# Patient Record
Sex: Female | Born: 1990 | Race: Black or African American | Hispanic: No | Marital: Single | Smoking: Never smoker
Health system: Southern US, Community
[De-identification: ages and names within clinical notes are randomized; demographics above are authoritative.]

## PROBLEM LIST (undated history)

## (undated) DIAGNOSIS — R000 Tachycardia, unspecified: Secondary | ICD-10-CM

## (undated) DIAGNOSIS — D649 Anemia, unspecified: Secondary | ICD-10-CM

## (undated) DIAGNOSIS — F419 Anxiety disorder, unspecified: Secondary | ICD-10-CM

## (undated) HISTORY — DX: Anemia, unspecified: D64.9

## (undated) HISTORY — DX: Anxiety disorder, unspecified: F41.9

## (undated) HISTORY — PX: PILONIDAL CYST EXCISION: SHX744

---

## 2015-03-14 ENCOUNTER — Encounter (HOSPITAL_COMMUNITY): Payer: Self-pay | Admitting: Emergency Medicine

## 2015-03-14 ENCOUNTER — Emergency Department (HOSPITAL_COMMUNITY)
Admission: EM | Admit: 2015-03-14 | Discharge: 2015-03-14 | Disposition: A | Discharge status: Home / Self Care | Payer: BLUE CROSS/BLUE SHIELD | Source: Home / Self Care | Attending: Family Medicine | Admitting: Family Medicine

## 2015-03-14 DIAGNOSIS — B354 Tinea corporis: Secondary | ICD-10-CM | POA: Diagnosis not present

## 2015-03-14 HISTORY — DX: Tachycardia, unspecified: R00.0

## 2015-03-14 MED ORDER — TERBINAFINE HCL 250 MG PO TABS: 250.0000 mg | ORAL_TABLET | Freq: Every day | ORAL | Status: DC

## 2015-03-14 MED ORDER — TERBINAFINE HCL 1 % EX CREA: 1.0000 "application " | TOPICAL_CREAM | Freq: Two times a day (BID) | CUTANEOUS | Status: DC

## 2015-03-14 NOTE — Discharge Instructions (Signed)
Use medicines as prescribed, return or see your doctor if further problems

## 2015-03-14 NOTE — ED Notes (Signed)
Pt developed a spot below her left ear around Father's Day, since then she has developed more spots below her right ear, her left arm, and parts of her breasts and upper front torso.  She denies any fevers and she denies pain or itching.

## 2015-03-14 NOTE — ED Provider Notes (Signed)
CSN: [id number]     Arrival date & time 03/14/15  1949 History   First MD Initiated Contact with Patient 03/14/15 2008     Chief Complaint  Patient presents with  . Rash   (Consider location/radiation/quality/duration/timing/severity/associated sxs/prior Treatment) Patient is a 24 y.o. female presenting with rash. The history is provided by the patient.  Rash Location:  Full body (not below waist.) Quality: blistering and redness   Quality: not itchy and not painful   Severity:  Mild Onset quality:  Gradual Duration:  3 weeks Progression:  Spreading Chronicity:  New Relieved by:  None tried Worsened by:  Nothing tried Ineffective treatments:  None tried Associated symptoms: no fever     Past Medical History  Diagnosis Date  . Tachycardia    Past Surgical History  Procedure Laterality Date  . Pilonidal cyst excision     History reviewed. No pertinent family history. History  Substance Use Topics  . Smoking status: Never Smoker   . Smokeless tobacco: Not on file  . Alcohol Use: Yes     Comment: occasional   OB History    No data available     Review of Systems  Constitutional: Negative.  Negative for fever.  Skin: Positive for rash.    Allergies  Review of patient's allergies indicates no known allergies.  Home Medications   Prior to Admission medications   Medication Sig Start Date End Date Taking? Authorizing Provider  Dextroamphetamine Sulfate (ZENZEDI) 20 MG TABS Take 20 mg by mouth daily.   Yes Historical Provider, MD  topiramate (TOPAMAX) 25 MG tablet Take 75 mg by mouth daily.   Yes Historical Provider, MD  terbinafine (LAMISIL) 1 % cream Apply 1 application topically 2 (two) times daily. 03/14/15   Linna HoffJames D Kindl, MD  terbinafine (LAMISIL) 250 MG tablet Take 1 tablet (250 mg total) by mouth daily. 03/14/15   Linna HoffJames D Kindl, MD   BP 114/80 mmHg  Pulse 110  Temp(Src) 98.7 F (37.1 C) (Oral)  Resp 18  SpO2 100%  LMP 02/08/2015 (Exact Date) Physical Exam   Constitutional: She is oriented to person, place, and time. She appears well-developed and well-nourished. No distress.  Musculoskeletal: Normal range of motion.  Neurological: She is alert and oriented to person, place, and time.  Skin: Skin is warm and dry. Rash noted. No pallor.  approx 10 isolated sl raised erythematous lesions with some central clearing, nonpustular, nonpruritic, nontender, irreg distribution above waist.  Nursing note and vitals reviewed.   ED Course  Procedures (including critical care time) Labs Review Labs Reviewed - No data to display  Imaging Review No results found.   MDM   1. Tinea corporis        Linna HoffJames D Kindl, MD 03/14/15 2047

## 2015-09-11 ENCOUNTER — Ambulatory Visit (INDEPENDENT_AMBULATORY_CARE_PROVIDER_SITE_OTHER): Payer: BLUE CROSS/BLUE SHIELD | Admitting: Physician Assistant

## 2015-09-11 VITALS — BP 122/84 | HR 85 | Temp 98.5°F | Resp 18 | Ht 67.75 in | Wt 287.0 lb

## 2015-09-11 DIAGNOSIS — Z23 Encounter for immunization: Secondary | ICD-10-CM

## 2015-09-11 DIAGNOSIS — Z111 Encounter for screening for respiratory tuberculosis: Secondary | ICD-10-CM

## 2015-09-11 DIAGNOSIS — Z0289 Encounter for other administrative examinations: Secondary | ICD-10-CM

## 2015-09-11 NOTE — Progress Notes (Signed)
   09/11/2015 8:13 PM   DOB: 23-Jan-1991 / MRN: [id number]  SUBJECTIVE:  Vickie [name] is a 25 y.o. female presenting for an administrative physical.  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  She has immunization records with her and they reveal the need for a TDAP.  She needs a TB screen.  No history of positive, persistent cough, weight loss, and night sweats.    She has No Known Allergies.   She  has a past medical history of Tachycardia and Anemia.    She  reports that she has never smoked. She does not have any smokeless tobacco history on file. She reports that she drinks alcohol. She reports that she does not use illicit drugs. She  has no sexual activity history on file. The patient  has past surgical history that includes Pilonidal cyst excision.  Her family history includes Diabetes in her maternal grandmother; Hyperlipidemia in her maternal grandfather and maternal grandmother; Hypertension in her maternal grandfather and maternal grandmother.  Review of Systems  Constitutional: Negative for fever and chills.  Eyes: Negative for blurred vision.  Respiratory: Negative for cough and shortness of breath.   Cardiovascular: Negative for chest pain.  Gastrointestinal: Negative for nausea and abdominal pain.  Genitourinary: Negative for dysuria, urgency and frequency.  Musculoskeletal: Negative for myalgias.  Skin: Negative for rash.  Neurological: Negative for dizziness, tingling and headaches.  Psychiatric/Behavioral: Negative for depression. The patient is not nervous/anxious.     Problem list and medications reviewed and updated by myself where necessary, and exist elsewhere in the encounter.   OBJECTIVE:  BP 122/84 mmHg  Pulse 85  Temp(Src) 98.5 F (36.9 C) (Oral)  Resp 18  Ht 5' 7.75" (1.721 m)  Wt 287 lb (130.182 kg)  BMI 43.95 kg/m2  SpO2 98%  LMP 09/05/2015  Physical Exam  Constitutional: She is oriented to person,  place, and time. She appears well-nourished. No distress.  Eyes: EOM are normal. Pupils are equal, round, and reactive to light.  Cardiovascular: Normal rate and regular rhythm.   Pulmonary/Chest: Effort normal and breath sounds normal.  Abdominal: She exhibits no distension.  Neurological: She is alert and oriented to person, place, and time. No cranial nerve deficit. Gait normal.  Skin: Skin is dry. She is not diaphoretic.  Psychiatric: She has a normal mood and affect.  Vitals reviewed.     Visual Acuity Screening   Right eye Left eye Both eyes  Without correction:     With correction: 20/15 20/13 20/15     No results found for this or any previous visit (from the past 48 hour(s)).  ASSESSMENT AND PLAN  Vickie [name] was seen today for employment physical.  Diagnoses and all orders for this visit:  Encounter for occupational history and physical examination -     Care order/instruction  Need for Tdap vaccination -     Tdap vaccine greater than or equal to 7yo IM  Screening-pulmonary TB -     TB Skin Test    The patient was advised to call or return to clinic if she does not see an improvement in symptoms or to seek the care of the closest emergency department if she worsens with the above plan.   Deliah BostonMichael Britney Captain, MHS, PA-C [facility] 09/11/2015 8:13 PM

## 2015-09-11 NOTE — Progress Notes (Signed)
   Subjective:    Patient ID: Vickie [name], female    DOB: [birth year], 25 y.o.   MRN: [id number]  HPI    Review of Systems     Objective:   Physical Exam  . Tuberculosis Risk Questionnaire  1. No Were you born outside the [place] in one of the following parts of the world: [place], [place], [place], [place] or [place]?    2. No Have you traveled outside the [place] and lived for more than one month in one of the following parts of the world: [place], [place], [place], [place] or [place]?    3. No Do you have a compromised immune system such as from any of the following conditions:HIV/AIDS, organ or bone marrow transplantation, diabetes, immunosuppressive medicines (e.g. Prednisone, Remicaide), leukemia, lymphoma, cancer of the head or neck, gastrectomy or jejunal bypass, end-stage renal disease (on dialysis), or silicosis?     4. No Have you ever or do you plan on working in: a residential care center, a health care facility, a jail or prison or homeless shelter?    5. No Have you ever: injected illegal drugs, used crack cocaine, lived in a homeless shelter  or been in jail or prison?     6. No Have you ever been exposed to anyone with infectious tuberculosis?    Tuberculosis Symptom Questionnaire  Do you currently have any of the following symptoms?  1. No Unexplained cough lasting more than 3 weeks?   2. No Unexplained fever lasting more than 3 weeks.   3. No Night Sweats (sweating that leaves the bedclothes and sheets wet)     4. No Shortness of Breath   5. No Chest Pain   6. No Unintentional weight loss    7. No Unexplained fatigue (very tired for no reason)        Assessment & Plan:

## 2015-09-19 ENCOUNTER — Ambulatory Visit (INDEPENDENT_AMBULATORY_CARE_PROVIDER_SITE_OTHER): Payer: BLUE CROSS/BLUE SHIELD | Admitting: *Deleted

## 2015-09-19 DIAGNOSIS — Z111 Encounter for screening for respiratory tuberculosis: Secondary | ICD-10-CM

## 2015-09-19 NOTE — Progress Notes (Signed)
   Subjective:    Patient ID: Vickie [name], female    DOB: 04-30-1991, 25 y.o.   MRN: [id number]  HPI   Tuberculosis Risk Questionnaire  1. No Were you born outside the [place] in one of the following parts of the world: [place], [place], [place], [place] or [place]?    2. No Have you traveled outside the [place] and lived for more than one month in one of the following parts of the world: [place], [place], [place], [place] or [place]?    3. No Do you have a compromised immune system such as from any of the following conditions:HIV/AIDS, organ or bone marrow transplantation, diabetes, immunosuppressive medicines (e.g. Prednisone, Remicaide), leukemia, lymphoma, cancer of the head or neck, gastrectomy or jejunal bypass, end-stage renal disease (on dialysis), or silicosis?     4. No Have you ever or do you plan on working in: a residential care center, a health care facility, a jail or prison or homeless shelter?    5. No Have you ever: injected illegal drugs, used crack cocaine, lived in a homeless shelter  or been in jail or prison?     6. No Have you ever been exposed to anyone with infectious tuberculosis?    Tuberculosis Symptom Questionnaire  Do you currently have any of the following symptoms?  1. No Unexplained cough lasting more than 3 weeks?   2. No Unexplained fever lasting more than 3 weeks.   3. No Night Sweats (sweating that leaves the bedclothes and sheets wet)     4. No Shortness of Breath   5. No Chest Pain   6. No Unintentional weight loss    7. No Unexplained fatigue (very tired for no reason)    Review of Systems     Objective:   Physical Exam        Assessment & Plan:

## 2015-09-22 ENCOUNTER — Ambulatory Visit (INDEPENDENT_AMBULATORY_CARE_PROVIDER_SITE_OTHER): Payer: BLUE CROSS/BLUE SHIELD | Admitting: *Deleted

## 2015-09-22 DIAGNOSIS — Z111 Encounter for screening for respiratory tuberculosis: Secondary | ICD-10-CM

## 2015-09-22 LAB — TB SKIN TEST
Induration: 0 mm
TB Skin Test: NEGATIVE

## 2015-10-27 ENCOUNTER — Encounter (HOSPITAL_COMMUNITY): Payer: Self-pay | Admitting: *Deleted

## 2015-10-27 ENCOUNTER — Emergency Department (INDEPENDENT_AMBULATORY_CARE_PROVIDER_SITE_OTHER)
Admission: EM | Admit: 2015-10-27 | Discharge: 2015-10-27 | Disposition: A | Discharge status: Home / Self Care | Payer: BLUE CROSS/BLUE SHIELD | Source: Home / Self Care | Attending: Family Medicine | Admitting: Family Medicine

## 2015-10-27 DIAGNOSIS — R69 Illness, unspecified: Principal | ICD-10-CM

## 2015-10-27 DIAGNOSIS — J111 Influenza due to unidentified influenza virus with other respiratory manifestations: Secondary | ICD-10-CM

## 2015-10-27 MED ORDER — IPRATROPIUM BROMIDE 0.06 % NA SOLN: 2.0000 | Freq: Four times a day (QID) | NASAL | Status: DC

## 2015-10-27 MED ORDER — GUAIFENESIN-CODEINE 100-10 MG/5ML PO SYRP: 10.0000 mL | ORAL_SOLUTION | Freq: Four times a day (QID) | ORAL | Status: DC | PRN

## 2015-10-27 NOTE — ED Provider Notes (Signed)
CSN: [id number]     Arrival date & time 10/27/15  1757 History   First MD Initiated Contact with Patient 10/27/15 1952     Chief Complaint  Patient presents with  . Generalized Body Aches   (Consider location/radiation/quality/duration/timing/severity/associated sxs/prior Treatment) Patient is a 25 y.o. female presenting with flu symptoms. The history is provided by the patient.  Influenza Presenting symptoms: cough, fever, myalgias, rhinorrhea and sore throat   Severity:  Moderate Onset quality:  Sudden Duration:  2 days Chronicity:  New Relieved by:  None tried Ineffective treatments:  OTC medications Associated symptoms: chills and nasal congestion     Past Medical History  Diagnosis Date  . Tachycardia   . Anemia    Past Surgical History  Procedure Laterality Date  . Pilonidal cyst excision     Family History  Problem Relation Age of Onset  . Diabetes Maternal Grandmother   . Hyperlipidemia Maternal Grandmother   . Hypertension Maternal Grandmother   . Hyperlipidemia Maternal Grandfather   . Hypertension Maternal Grandfather    Social History  Substance Use Topics  . Smoking status: Never Smoker   . Smokeless tobacco: None  . Alcohol Use: Yes     Comment: occasional   OB History    No data available     Review of Systems  Constitutional: Positive for fever and chills.  HENT: Positive for congestion, rhinorrhea, sore throat and voice change.   Respiratory: Positive for cough.   Cardiovascular: Negative.   Gastrointestinal: Negative.   Genitourinary: Negative.   Musculoskeletal: Positive for myalgias.  Skin: Negative.   All other systems reviewed and are negative.   Allergies  Review of patient's allergies indicates no known allergies.  Home Medications   Prior to Admission medications   Medication Sig Start Date End Date Taking? Authorizing Provider  Dextroamphetamine Sulfate (ZENZEDI) 20 MG TABS Take 20 mg by mouth daily. Reported on 09/11/2015     Historical Provider, MD  norgestimate-ethinyl estradiol (ORTHO-CYCLEN,SPRINTEC,PREVIFEM) 0.25-35 MG-MCG tablet Take 1 tablet by mouth daily.    Historical Provider, MD  terbinafine (LAMISIL) 1 % cream Apply 1 application topically 2 (two) times daily. Patient not taking: Reported on 09/11/2015 03/14/15   Linna Hoff, MD  terbinafine (LAMISIL) 250 MG tablet Take 1 tablet (250 mg total) by mouth daily. Patient not taking: Reported on 09/11/2015 03/14/15   Linna Hoff, MD  topiramate (TOPAMAX) 25 MG tablet Take 75 mg by mouth daily. Reported on 09/11/2015    Historical Provider, MD   Meds Ordered and Administered this Visit  Medications - No data to display  BP 120/82 mmHg  Pulse 101  Temp(Src) 98.9 F (37.2 C) (Oral)  Resp 16  SpO2 99%  LMP 10/10/2015 No data found.   Physical Exam  Constitutional: She is oriented to person, place, and time. She appears well-developed and well-nourished. No distress.  HENT:  Right Ear: External ear normal.  Left Ear: External ear normal.  Mouth/Throat: Oropharynx is clear and moist.  Neck: Normal range of motion. Neck supple.  Cardiovascular: Normal heart sounds.   Pulmonary/Chest: Effort normal and breath sounds normal.  Abdominal: Soft. Bowel sounds are normal.  Neurological: She is alert and oriented to person, place, and time.  Skin: Skin is warm and dry.  Nursing note and vitals reviewed.   ED Course  Procedures (including critical care time)  Labs Review Labs Reviewed - No data to display  Imaging Review No results found.   Visual Acuity Review  Right Eye Distance:   Left Eye Distance:   Bilateral Distance:    Right Eye Near:   Left Eye Near:    Bilateral Near:         MDM  No diagnosis found.  Meds ordered this encounter  Medications  . ipratropium (ATROVENT) 0.06 % nasal spray    Sig: Place 2 sprays into both nostrils 4 (four) times daily.    Dispense:  15 mL    Refill:  12  . guaiFENesin-codeine (ROBITUSSIN AC)  100-10 MG/5ML syrup    Sig: Take 10 mLs by mouth 4 (four) times daily as needed for cough.    Dispense:  180 mL    Refill:  0     Linna Hoff, MD 10/27/15 2005

## 2015-10-27 NOTE — Discharge Instructions (Signed)
Drink plenty of fluids as discussed, use medicine as prescribed, and mucinex or delsym for cough. Return or see your doctor if further problems °

## 2015-10-27 NOTE — ED Notes (Signed)
Pt  Has  Multiple   Symptoms     To  Include  Body  Aches   Chills      Sorethroat   And  Loss  Of  Voice   With  Symptoms     For     2  Days

## 2016-04-12 ENCOUNTER — Ambulatory Visit (INDEPENDENT_AMBULATORY_CARE_PROVIDER_SITE_OTHER): Payer: BC Managed Care – PPO | Admitting: Emergency Medicine

## 2016-04-12 VITALS — BP 122/80 | HR 103 | Temp 98.9°F | Resp 18 | Ht 67.75 in | Wt 275.0 lb

## 2016-04-12 DIAGNOSIS — R112 Nausea with vomiting, unspecified: Secondary | ICD-10-CM | POA: Diagnosis not present

## 2016-04-12 DIAGNOSIS — R1110 Vomiting, unspecified: Secondary | ICD-10-CM | POA: Insufficient documentation

## 2016-04-12 DIAGNOSIS — Z202 Contact with and (suspected) exposure to infections with a predominantly sexual mode of transmission: Secondary | ICD-10-CM | POA: Diagnosis not present

## 2016-04-12 LAB — POCT CBC
GRANULOCYTE PERCENT: 54.1 % (ref 37–80)
HEMATOCRIT: 39.6 % (ref 37.7–47.9)
Hemoglobin: 13.4 g/dL (ref 12.2–16.2)
Lymph, poc: 2.5 (ref 0.6–3.4)
MCH, POC: 24.2 pg — AB (ref 27–31.2)
MCHC: 33.8 g/dL (ref 31.8–35.4)
MCV: 71.4 fL — AB (ref 80–97)
MID (CBC): 0.2 (ref 0–0.9)
MPV: 6.7 fL (ref 0–99.8)
POC GRANULOCYTE: 3.1 (ref 2–6.9)
POC LYMPH %: 43 % (ref 10–50)
POC MID %: 2.9 % (ref 0–12)
Platelet Count, POC: 365 10*3/uL (ref 142–424)
RBC: 5.55 M/uL — AB (ref 4.04–5.48)
RDW, POC: 16.2 %
WBC: 5.7 10*3/uL (ref 4.6–10.2)

## 2016-04-12 LAB — COMPREHENSIVE METABOLIC PANEL
ALBUMIN: 4.1 g/dL (ref 3.6–5.1)
ALT: 10 U/L (ref 6–29)
AST: 13 U/L (ref 10–30)
Alkaline Phosphatase: 64 U/L (ref 33–115)
BUN: 6 mg/dL — ABNORMAL LOW (ref 7–25)
CHLORIDE: 101 mmol/L (ref 98–110)
CO2: 23 mmol/L (ref 20–31)
Calcium: 9.2 mg/dL (ref 8.6–10.2)
Creat: 0.63 mg/dL (ref 0.50–1.10)
Glucose, Bld: 79 mg/dL (ref 65–99)
Potassium: 4 mmol/L (ref 3.5–5.3)
Sodium: 138 mmol/L (ref 135–146)
Total Bilirubin: 0.5 mg/dL (ref 0.2–1.2)
Total Protein: 7.8 g/dL (ref 6.1–8.1)

## 2016-04-12 LAB — POCT URINE PREGNANCY: Preg Test, Ur: NEGATIVE

## 2016-04-12 NOTE — Patient Instructions (Addendum)
Thank you for coming in,   We will call with the results from today.   Please follow up with us if you are not improving.    Please feel free to call with any questions or concerns at any time, at [phone]. --Dr. Jordan LikesSchmitz    IF you received an x-ray today, you will receive an invoice from [facility]. Please contact [facility] at [phone] with questions or concerns regarding your invoice.   IF you received labwork today, you will receive an invoice from [facility]. Please contact [facility] at [phone] with questions or concerns regarding your invoice.   Our billing staff will not be able to assist you with questions regarding bills from these companies.  You will be contacted with the lab results as soon as they are available. The fastest way to get your results is to activate your My Chart account. Instructions are located on the last page of this paperwork. If you have not heard from us regarding the results in 2 weeks, please contact this office.

## 2016-04-12 NOTE — Assessment & Plan Note (Signed)
POC pregnancy test in negative. Possible for viral gastro. Doesn't appear to be pancreatitis or appendicitis with no pain with exam today.  - CBC and CMP today  - encouraged bland diet  - advised to follow up if no improving.

## 2016-04-12 NOTE — Progress Notes (Signed)
Subjective:    Patient ID: Vickie [name] First, female    DOB: Nov 17, 1990, 25 y.o.   MRN: [id number]  Chief Complaint  Patient presents with  . Nausea  . Emesis  . Fatigue    PCP: No primary care provider on file.  HPI  Vickie [name] is a 25 yo F that is presenting with nausea, tiredness, and stomach cramps. This has been occurring for roughly one weeks.  Has had mucous build up in her throat. everytime she tries to eat she becomes nauseous. It has been intermittent in nature. Certain smells bother her.  Has had one episode of emesis. Has had diarrhea last Wednesday and Sunday. Her bowel movements were non bloody. She has gotten hot but hasn't taken her temperature. No sick contacts. [place] Has taken pepto bismal and it helped some. Hasn't had these symptoms. She works at an Radio [employer] and works as a Runner, broadcasting/film/videoteacher at [employer].   Has had unprotected intercourse. Her LMP was the July 27th. Her cycle is usually every 21 days. She hasn't taken any OCP'[name] since July 21 or 22.  She has had two negative home pregnancy tests.   Review of Systems  ROS: No unexpected weight loss, fever, chills, swelling, instability, muscle pain, numbness/tingling, redness, otherwise see HPI   PMH: tachycardia,   FAMHX: HTN, HLD, DM2 SOCHX: occasional alcohol use    Objective:   Physical Exam BP 122/80 (BP Location: Right Arm, Patient Position: Sitting, Cuff Size: Large)   Pulse (!) 103   Temp 98.9 F (37.2 C) (Oral)   Resp 18   Ht 5' 7.75" (1.721 m)   Wt 275 lb (124.7 kg)   LMP 04/01/2016   SpO2 98%   BMI 42.12 kg/m  Gen: NAD, alert, cooperative with exam, well-appearing HEENT: EOMI, clear conjunctiva, oropharynx clear, supple neck, no cervical LAD, no oral lesions  CV: Tachycardic, good S1/S2, no murmur, no edema, capillary refill brisk  Resp: CTABL, no wheezes, non-labored Abd: SNTND, BS present, no guarding or organomegaly Skin: no rashes, normal turgor    Neuro: no gross deficits.  Psych: alert and oriented Results for orders placed or performed in visit on 04/12/16  POCT urine pregnancy  Result Value Ref Range   Preg Test, Ur Negative Negative  POCT CBC  Result Value Ref Range   WBC 5.7 4.6 - 10.2 K/uL   Lymph, poc 2.5 0.6 - 3.4   POC LYMPH PERCENT 43.0 10 - 50 %L   MID (cbc) 0.2 0 - 0.9   POC MID % 2.9 0 - 12 %M   POC Granulocyte 3.1 2 - 6.9   Granulocyte percent 54.1 37 - 80 %G   RBC 5.55 (A) 4.04 - 5.48 M/uL   Hemoglobin 13.4 12.2 - 16.2 g/dL   HCT, POC 01.039.6 27.237.7 - 47.9 %   MCV 71.4 (A) 80 - 97 fL   MCH, POC 24.2 (A) 27 - 31.2 pg   MCHC 33.8 31.8 - 35.4 g/dL   RDW, POC 53.616.2 %   Platelet Count, POC 365 142 - 424 K/uL   MPV 6.7 0 - 99.8 fL       Assessment & Plan:    Emesis POC pregnancy test in negative. Possible for viral gastro. Doesn't appear to be pancreatitis or appendicitis with no pain with exam today.  - CBC and CMP today  - encouraged bland diet  - advised to follow up if no improving.

## 2016-04-13 LAB — GC/CHLAMYDIA PROBE AMP
CT PROBE, AMP APTIMA: NOT DETECTED
GC Probe RNA: NOT DETECTED

## 2016-05-11 ENCOUNTER — Ambulatory Visit (INDEPENDENT_AMBULATORY_CARE_PROVIDER_SITE_OTHER): Payer: BC Managed Care – PPO | Admitting: Physician Assistant

## 2016-05-11 VITALS — BP 120/78 | HR 75 | Temp 98.2°F | Resp 16 | Ht 67.75 in | Wt 280.6 lb

## 2016-05-11 DIAGNOSIS — A499 Bacterial infection, unspecified: Secondary | ICD-10-CM

## 2016-05-11 DIAGNOSIS — R8290 Unspecified abnormal findings in urine: Secondary | ICD-10-CM | POA: Diagnosis not present

## 2016-05-11 DIAGNOSIS — B9689 Other specified bacterial agents as the cause of diseases classified elsewhere: Secondary | ICD-10-CM

## 2016-05-11 DIAGNOSIS — R3915 Urgency of urination: Secondary | ICD-10-CM

## 2016-05-11 DIAGNOSIS — N760 Acute vaginitis: Secondary | ICD-10-CM

## 2016-05-11 LAB — POCT WET + KOH PREP
Trich by wet prep: ABSENT
Yeast by KOH: ABSENT
Yeast by wet prep: ABSENT

## 2016-05-11 LAB — POCT URINALYSIS DIP (MANUAL ENTRY)
Bilirubin, UA: NEGATIVE
Glucose, UA: NEGATIVE
Ketones, POC UA: NEGATIVE
NITRITE UA: NEGATIVE
PH UA: 5.5
Protein Ur, POC: NEGATIVE
RBC UA: NEGATIVE
Spec Grav, UA: <=1.005
UROBILINOGEN UA: 0.2

## 2016-05-11 LAB — POC MICROSCOPIC URINALYSIS (UMFC): Mucus: ABSENT

## 2016-05-11 MED ORDER — METRONIDAZOLE 500 MG PO TABS: 500.0000 mg | ORAL_TABLET | Freq: Three times a day (TID) | ORAL | 0 refills | Status: DC

## 2016-05-11 MED ORDER — METRONIDAZOLE 500 MG PO TABS: 500.0000 mg | ORAL_TABLET | Freq: Two times a day (BID) | ORAL | 0 refills | Status: AC

## 2016-05-11 NOTE — Progress Notes (Signed)
05/12/2016 at 9:13 AM  Vickie [name] / DOB: 06-18-1991 / MRN: [id number]  The patient has Emesis on her problem list.  SUBJECTIVE  Vickie [name] is a 25 y.o. female who complains of dysuria, urinary urgency, flank pain and cloudy malordorous urine x 3 weeks. Notes the odor has been there for a couple of months but she would try and just drink water to clear it. Each time she would drink soda, the odor would come back. She denies hematuria, urinary frequency, genital rash, genital irritation and vaginal discharge. Has tried water and advil with no relief. Most recent UTI prior to this was 10 years ago. Pt is sexually active with one monogamous partner. States that they were both recently tested about a month ago and results were negative.   She  has a past medical history of Anemia and Tachycardia.    Medications reviewed and updated by myself where necessary, and exist elsewhere in the encounter.   Vickie [name] has No Known Allergies. She  reports that she has never smoked. She has never used smokeless tobacco. She reports that she drinks alcohol. She reports that she does not use drugs. She  has no sexual activity history on file. The patient  has a past surgical history that includes Pilonidal cyst excision.  Her family history includes Diabetes in her maternal grandmother; Hyperlipidemia in her maternal grandfather and maternal grandmother; Hypertension in her maternal grandfather and maternal grandmother.  Review of Systems  Constitutional: Negative for chills, diaphoresis and fever.  Cardiovascular: Negative for palpitations.  Neurological: Negative for weakness.    OBJECTIVE  Her  height is 5' 7.75" (1.721 m) and weight is 280 lb 9.6 oz (127.3 kg). Her oral temperature is 98.2 F (36.8 C). Her blood pressure is 120/78 and her pulse is 75. Her respiration is 16 and oxygen saturation is 100%.  The patient's body mass index is 42.98 kg/m.  Physical Exam  Constitutional: She appears  well-developed and well-nourished.  HENT:  Head: Normocephalic and atraumatic.  Neck: Normal range of motion.  Respiratory: Effort normal.  GI: Soft. There is no tenderness.  Genitourinary: Uterus normal.    There is no rash on the right labia. There is no rash on the left labia. Cervix exhibits no motion tenderness, no discharge and no friability. Right adnexum displays no tenderness and no fullness. Left adnexum displays no tenderness and no fullness. No tenderness in the vagina. Vaginal discharge ( thick creamy white) found.    Results for orders placed or performed in visit on 05/11/16 (from the past 24 hour(s))  POCT urinalysis dipstick     Status: Abnormal   Collection Time: 05/11/16  5:33 PM  Result Value Ref Range   Color, UA yellow yellow   Clarity, UA clear clear   Glucose, UA negative negative   Bilirubin, UA negative negative   Ketones, POC UA negative negative   Spec Grav, UA <=1.005    Blood, UA negative negative   pH, UA 5.5    Protein Ur, POC negative negative   Urobilinogen, UA 0.2    Nitrite, UA Negative Negative   Leukocytes, UA Trace (A) Negative  POCT Microscopic Urinalysis ([facility])     Status: Abnormal   Collection Time: 05/11/16  5:37 PM  Result Value Ref Range   WBC,UR,HPF,POC None None WBC/hpf   RBC,UR,HPF,POC None None RBC/hpf   Bacteria Moderate (A) None, Too numerous to count   Mucus Absent Absent   Epithelial Cells, UR Per  Microscopy Few (A) None, Too numerous to count cells/hpf  POCT Wet + KOH Prep     Status: Abnormal   Collection Time: 05/11/16  6:15 PM  Result Value Ref Range   Yeast by KOH Absent Present, Absent   Yeast by wet prep Absent Present, Absent   WBC by wet prep Few None, Few, Too numerous to count   Clue Cells Wet Prep HPF POC Moderate (A) None, Too numerous to count   Trich by wet prep Absent Present, Absent   Bacteria Wet Prep HPF POC Moderate (A) None, Few, Too numerous to count   Epithelial Cells By [employer] ([facility]) None None,  Few, Too numerous to count   RBC,UR,HPF,POC None None RBC/hpf    ASSESSMENT & PLAN  Vickie [name] was seen today for urinary frequency.  Diagnoses and all orders for this visit:  Urinary urgency -     POCT urinalysis dipstick -     POCT Microscopic Urinalysis ([facility]) -     Urine culture  Abnormal urine odor -     GC/Chlamydia Probe Amp -     POCT Wet + KOH Prep -     Discontinue: metroNIDAZOLE (FLAGYL) 500 MG tablet; Take 1 tablet (500 mg total) by mouth 3 (three) times daily.  BV (bacterial vaginosis) -     metroNIDAZOLE (FLAGYL) 500 MG tablet; Take 1 tablet (500 mg total) by mouth 2 (two) times daily.   The patient was advised to call or come back to clinic if she does not see an improvement in symptoms, or worsens with the above plan.   Benjiman Core, PA-C [facility] 05/12/2016 9:13 AM

## 2016-05-11 NOTE — Patient Instructions (Addendum)
  Do not drink alcohol while on medication.    IF you received an x-ray today, you will receive an invoice from [facility]. Please contact [facility] at [phone] with questions or concerns regarding your invoice.   IF you received labwork today, you will receive an invoice from [facility]. Please contact [facility] at [phone] with questions or concerns regarding your invoice.   Our billing staff will not be able to assist you with questions regarding bills from these companies.  You will be contacted with the lab results as soon as they are available. The fastest way to get your results is to activate your My Chart account. Instructions are located on the last page of this paperwork. If you have not heard from us regarding the results in 2 weeks, please contact this office.      Bacterial Vaginosis Bacterial vaginosis is an infection of the vagina. It happens when too many germs (bacteria) grow in the vagina. Having this infection puts you at risk for getting other infections from sex. Treating this infection can help lower your risk for other infections, such as:   Chlamydia.  Gonorrhea.  HIV.  Herpes. HOME CARE  Take your medicine as told by your doctor.  Finish your medicine even if you start to feel better.  Tell your sex partner that you have an infection. They should see their doctor for treatment.  During treatment:  Avoid sex or use condoms correctly.  Do not douche.  Do not drink alcohol unless your doctor tells you it is ok.  Do not breastfeed unless your doctor tells you it is ok. GET HELP IF:  You are not getting better after 3 days of treatment.  You have more grey fluid (discharge) coming from your vagina than before.  You have more pain than before.  You have a fever. MAKE SURE YOU:   Understand these instructions.  Will watch your condition.  Will get help right away if you are not doing well  or get worse.   This information is not intended to replace advice given to you by your health care provider. Make sure you discuss any questions you have with your health care provider.   Document Released: 06/01/2008 Document Revised: 09/13/2014 Document Reviewed: 04/04/2013 Elsevier Interactive Patient Education [employer]2016 Elsevier Inc.

## 2016-05-13 LAB — GC/CHLAMYDIA PROBE AMP
CT Probe RNA: NOT DETECTED
GC Probe RNA: NOT DETECTED

## 2016-05-15 LAB — URINE CULTURE

## 2016-05-18 ENCOUNTER — Telehealth: Payer: Self-pay | Admitting: Physician Assistant

## 2016-05-18 MED ORDER — NITROFURANTOIN MONOHYD MACRO 100 MG PO CAPS: 100.0000 mg | ORAL_CAPSULE | Freq: Two times a day (BID) | ORAL | 0 refills | Status: AC

## 2016-05-18 NOTE — Telephone Encounter (Signed)
Pt contacted about urine culture results. Prescribed Macrobid 100mg  BID x 5 days. She was also instructed to hydrate sufficiently with water. Pt to return to clinic if symptoms worsen, do not improve, or as needed.

## 2016-05-26 ENCOUNTER — Ambulatory Visit: Payer: BC Managed Care – PPO

## 2016-06-01 ENCOUNTER — Other Ambulatory Visit: Payer: Self-pay | Admitting: Physician Assistant

## 2016-06-01 ENCOUNTER — Ambulatory Visit (INDEPENDENT_AMBULATORY_CARE_PROVIDER_SITE_OTHER): Payer: BC Managed Care – PPO | Admitting: Physician Assistant

## 2016-06-01 VITALS — BP 116/80 | HR 72 | Temp 98.2°F | Resp 17 | Ht 67.0 in | Wt 276.0 lb

## 2016-06-01 DIAGNOSIS — Z0000 Encounter for general adult medical examination without abnormal findings: Secondary | ICD-10-CM

## 2016-06-01 DIAGNOSIS — Z130 Encounter for screening for diseases of the blood and blood-forming organs and certain disorders involving the immune mechanism: Secondary | ICD-10-CM | POA: Diagnosis not present

## 2016-06-01 DIAGNOSIS — Z1159 Encounter for screening for other viral diseases: Secondary | ICD-10-CM

## 2016-06-01 DIAGNOSIS — Z23 Encounter for immunization: Secondary | ICD-10-CM

## 2016-06-01 DIAGNOSIS — Z1329 Encounter for screening for other suspected endocrine disorder: Secondary | ICD-10-CM

## 2016-06-01 DIAGNOSIS — Z114 Encounter for screening for human immunodeficiency virus [HIV]: Secondary | ICD-10-CM | POA: Diagnosis not present

## 2016-06-01 DIAGNOSIS — Z13220 Encounter for screening for lipoid disorders: Secondary | ICD-10-CM

## 2016-06-01 DIAGNOSIS — Z0289 Encounter for other administrative examinations: Secondary | ICD-10-CM

## 2016-06-01 DIAGNOSIS — Z131 Encounter for screening for diabetes mellitus: Secondary | ICD-10-CM | POA: Diagnosis not present

## 2016-06-01 NOTE — Patient Instructions (Signed)
     IF you received an x-ray today, you will receive an invoice from [facility]. Please contact [facility] at [phone] with questions or concerns regarding your invoice.   IF you received labwork today, you will receive an invoice from [facility]. Please contact [facility] at [phone] with questions or concerns regarding your invoice.   Our billing staff will not be able to assist you with questions regarding bills from these companies.  You will be contacted with the lab results as soon as they are available. The fastest way to get your results is to activate your My Chart account. Instructions are located on the last page of this paperwork. If you have not heard from us regarding the results in 2 weeks, please contact this office.      

## 2016-06-01 NOTE — Progress Notes (Signed)
06/01/2016 7:29 PM   DOB: 1991-03-09 / MRN: [id number]  SUBJECTIVE:  Vickie [name] is a 25 y.o. female presenting for an annual physical.  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 She had the HPV series in her teens. She had a pap a year ago and reports this was normal. She mensurates on a regular schedule and  monthly and says she bleeds mildly.  She denies a family history of CAD.  She reports a family history of diabetes.   She does not exercise frequently. Reports a family history of breast cancer in her grandmother, however this was late in life.  Denies a history of this in her mother or siblings.    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 Her last PPD is documented below.  She has not traveled outside the country since that time. Denies fever, weight loss, night sweats, unintentional weight loss, and cough. Denies a history of low back problems.       Immunization History  Administered Date(s) Administered  . PPD Test 09/11/2015, 09/19/2015  . Tdap 09/11/2015    She has No Known Allergies.   She  has a past medical history of Anemia; Anxiety; and Tachycardia.    She  reports that she has never smoked. She has never used smokeless tobacco. She reports that she drinks alcohol. She reports that she does not use drugs. She  has no sexual activity history on file. The patient  has a past surgical history that includes Pilonidal cyst excision.  Her family history includes Diabetes in her maternal grandfather, maternal grandmother, and paternal grandmother; Hyperlipidemia in her maternal grandfather and maternal grandmother; Hypertension in her maternal grandfather and maternal grandmother; Mental illness in her mother.  Review of Systems  Constitutional: Negative for chills and fever.  Skin: Negative for rash.    The problem list and medications were reviewed and updated by myself where necessary and exist elsewhere in the encounter.    OBJECTIVE:  BP 116/80 (BP Location: Right Arm, Patient Position: Sitting, Cuff Size: Large)   Pulse 72   Temp 98.2 F (36.8 C) (Oral)   Resp 17   Ht 5\' 7"  (1.702 m)   Wt 276 lb (125.2 kg)   LMP 05/03/2016   SpO2 100%   BMI 43.23 kg/m   Physical Exam  Constitutional: She is oriented to person, place, and time. She appears well-developed and well-nourished. No distress.  Cardiovascular: Normal rate, regular rhythm and normal heart sounds.   Pulmonary/Chest: Effort normal and breath sounds normal.  Abdominal: Soft. Bowel sounds are normal.  Musculoskeletal: Normal range of motion. She exhibits no edema, tenderness or deformity.  Neurological: She is alert and oriented to person, place, and time. No cranial nerve deficit.  Skin: Skin is warm and dry. She is not diaphoretic.  Psychiatric: She has a normal mood and affect.     Visual Acuity Screening   Right eye Left eye Both eyes  Without correction:     With correction: 20/20 20/25 20/20      No results found for this or any previous visit (from the past 72 hour(s)).  No results found.  ASSESSMENT AND PLAN  Annelyse was seen today for annual exam.  Diagnoses and all orders for this visit:  Annual physical exam: Pap negative 1 year ago per patient.  Several screenings are due and I will fulfill those today.   Screening for HIV (human immunodeficiency virus) -  HIV antibody  Screening for deficiency anemia -     CBC  Screening for diabetes mellitus -     Hemoglobin A1c  Screening for thyroid disorder -     TSH  Screening for lipid disorders -     Lipid panel  Encounter for occupational health examination: I have completed her form stating that she is free of infectious disease.  Screening Hep B      -     Hep B surface antigen       -     Hep B antibody  Needs flu shot: Patient declined.     The patient is advised to call or return to clinic if she does not see an improvement in symptoms, or to seek  the care of the closest emergency department if she worsens with the above plan.   Deliah BostonMichael Jebadiah Imperato, MHS, PA-C [facility] 06/01/2016 7:29 PM

## 2016-06-02 LAB — LIPID PANEL
CHOLESTEROL: 186 mg/dL (ref 125–200)
HDL: 54 mg/dL (ref >=46)
LDL CALC: 118 mg/dL (ref <130)
TRIGLYCERIDES: 68 mg/dL (ref <150)
Total CHOL/HDL Ratio: 3.4 Ratio (ref <=5.0)
VLDL: 14 mg/dL (ref <30)

## 2016-06-02 LAB — CBC
HCT: 37.1 % (ref 35.0–45.0)
HEMOGLOBIN: 12.1 g/dL (ref 11.7–15.5)
MCH: 23.5 pg — ABNORMAL LOW (ref 27.0–33.0)
MCHC: 32.6 g/dL (ref 32.0–36.0)
MCV: 72 fL — ABNORMAL LOW (ref 80.0–100.0)
MPV: 9.3 fL (ref 7.5–12.5)
Platelets: 313 10*3/uL (ref 140–400)
RBC: 5.15 MIL/uL — AB (ref 3.80–5.10)
RDW: 16.6 % — ABNORMAL HIGH (ref 11.0–15.0)
WBC: 7.2 10*3/uL (ref 3.8–10.8)

## 2016-06-02 LAB — IRON,TIBC AND FERRITIN PANEL
%SAT: 10 % — ABNORMAL LOW (ref 11–50)
FERRITIN: 14 ng/mL (ref 10–154)
Iron: 36 ug/dL — ABNORMAL LOW (ref 40–190)
TIBC: 373 ug/dL (ref 250–450)

## 2016-06-02 LAB — TSH: TSH: 0.84 mIU/L

## 2016-06-02 LAB — HIV ANTIBODY (ROUTINE TESTING W REFLEX): HIV 1&2 Ab, 4th Generation: NONREACTIVE

## 2016-06-02 LAB — HEPATITIS B SURFACE ANTIGEN: Hepatitis B Surface Ag: NEGATIVE

## 2016-06-02 LAB — HEPATITIS B SURFACE ANTIBODY, QUANTITATIVE: Hepatitis B-Post: 20.9 m[IU]/mL

## 2016-06-03 LAB — HEMOGLOBIN A1C
Hgb A1c MFr Bld: 5.1 % (ref <5.7)
MEAN PLASMA GLUCOSE: 100 mg/dL

## 2016-06-03 NOTE — Progress Notes (Signed)
Please advise that she take Iron 325 BID for 3 months.  RTC after this time for anemia recheck.  Deliah BostonMichael Clark, MS, PA-C 8:12 AM, 06/03/2016

## 2016-06-06 NOTE — Progress Notes (Signed)
She is anemic.  Meaning her iron is too low. Likely secondary to menstruation. Deliah BostonMichael Reace Breshears, MS, PA-C 1:18 PM, 06/06/2016

## 2018-04-18 ENCOUNTER — Emergency Department (HOSPITAL_COMMUNITY)
Admission: EM | Admit: 2018-04-18 | Discharge: 2018-04-18 | Disposition: A | Discharge status: Home / Self Care | Payer: BC Managed Care – PPO | Attending: Emergency Medicine | Admitting: Emergency Medicine

## 2018-04-18 ENCOUNTER — Other Ambulatory Visit: Payer: Self-pay

## 2018-04-18 ENCOUNTER — Encounter (HOSPITAL_COMMUNITY): Payer: Self-pay

## 2018-04-18 DIAGNOSIS — G43909 Migraine, unspecified, not intractable, without status migrainosus: Secondary | ICD-10-CM | POA: Diagnosis present

## 2018-04-18 DIAGNOSIS — G43809 Other migraine, not intractable, without status migrainosus: Secondary | ICD-10-CM | POA: Diagnosis not present

## 2018-04-18 LAB — POC URINE PREG, ED: Preg Test, Ur: NEGATIVE

## 2018-04-18 MED ORDER — DIPHENHYDRAMINE HCL 25 MG PO CAPS
25.0000 mg | ORAL_CAPSULE | Freq: Once | ORAL | Status: AC
  Administered 2018-04-18: 25 mg via ORAL
  Filled 2018-04-18: qty 1

## 2018-04-18 MED ORDER — PROCHLORPERAZINE MALEATE 10 MG PO TABS
10.0000 mg | ORAL_TABLET | Freq: Once | ORAL | Status: AC
  Administered 2018-04-18: 10 mg via ORAL
  Filled 2018-04-18: qty 1

## 2018-04-18 MED ORDER — DEXAMETHASONE 4 MG PO TABS
10.0000 mg | ORAL_TABLET | Freq: Once | ORAL | Status: AC
  Administered 2018-04-18: 10 mg via ORAL
  Filled 2018-04-18: qty 2

## 2018-04-18 NOTE — ED Provider Notes (Signed)
[facility]-EMERGENCY DEPT Provider Note   CSN: [id number] Arrival date & time: 04/18/18  1532     History   Chief Complaint Chief Complaint  Patient presents with  . Migraine  . Allergic Reaction    HPI Vickie [name] is a 27 y.o. female.  The history is provided by the patient.  Migraine  This is a chronic problem. The current episode started yesterday. The problem occurs constantly. The problem has not changed since onset.Associated symptoms include headaches. Pertinent negatives include no chest pain, no abdominal pain and no shortness of breath. Exacerbated by: light. Nothing relieves the symptoms. She has tried acetaminophen (NSAID but felt some lip tingling after taking it today. ) for the symptoms. The treatment provided no relief.    Past Medical History:  Diagnosis Date  . Anemia   . Anxiety   . Tachycardia     Patient Active Problem List   Diagnosis Date Noted  . Emesis 04/12/2016    Past Surgical History:  Procedure Laterality Date  . PILONIDAL CYST EXCISION       OB History   None      Home Medications    Prior to Admission medications   Medication Sig Start Date End Date Taking? Authorizing Provider  Amphetamine Sulfate (EVEKEO) 10 MG TABS Take 5-10 mg by mouth See admin instructions. Take 10 mg at 7 AM and 5 mg at 12 PM.   Yes [provider]  clonazePAM (KLONOPIN) 1 MG tablet Take 0.5-1 mg by mouth at bedtime as needed for sleep or anxiety. At 9 PM. 03/13/18  Yes [provider]  magnesium oxide (MAG-OX) 400 MG tablet Take 1 tablet by mouth 2 (two) times daily. 04/06/18  Yes [provider]  TROKENDI XR 50 MG CP24 Take 50 mg by mouth at bedtime.  04/07/18  Yes [provider]    Family History Family History  Problem Relation Age of Onset  . Mental illness Mother   . Diabetes Maternal Grandmother   . Hyperlipidemia Maternal Grandmother   . Hypertension Maternal Grandmother   .  Hyperlipidemia Maternal Grandfather   . Hypertension Maternal Grandfather   . Diabetes Maternal Grandfather   . Diabetes Paternal Grandmother     Social History Social History   Tobacco Use  . Smoking status: Never Smoker  . Smokeless tobacco: Never Used  Substance Use Topics  . Alcohol use: Yes    Comment: occasional  . Drug use: No     Allergies   Patient has no known allergies.   Review of Systems Review of Systems  Constitutional: Negative for chills and fever.  HENT: Negative for drooling, ear pain, facial swelling, rhinorrhea, sinus pressure, sinus pain, sore throat, trouble swallowing and voice change.   Eyes: Negative for pain and visual disturbance.  Respiratory: Negative for cough, shortness of breath and wheezing.   Cardiovascular: Negative for chest pain and palpitations.  Gastrointestinal: Negative for abdominal pain, nausea and vomiting.  Genitourinary: Negative for dysuria and hematuria.  Musculoskeletal: Negative for arthralgias and back pain.  Skin: Negative for color change and rash.  Neurological: Positive for headaches. Negative for dizziness, tremors, seizures, syncope, facial asymmetry, speech difficulty, weakness, light-headedness and numbness.  All other systems reviewed and are negative.    Physical Exam Updated Vital Signs  ED Triage Vitals  Enc Vitals Group     BP 04/18/18 1538 (!) 130/98     Pulse Rate 04/18/18 1538 100     Resp  04/18/18 1538 17     Temp 04/18/18 1538 98.3 F (36.8 C)     Temp Source 04/18/18 1538 Oral     SpO2 04/18/18 1538 100 %     Weight 04/18/18 1538 244 lb (110.7 kg)     Height 04/18/18 1538 5\' 8"  (1.727 m)     Head Circumference --      Peak Flow --      Pain Score 04/18/18 1626 7     Pain Loc --      Pain Edu? --      Excl. in GC? --     Physical Exam  Constitutional: She is oriented to person, place, and time. She appears well-developed and well-nourished. No distress.  HENT:  Head: Normocephalic and  atraumatic.  Mouth/Throat: Oropharynx is clear and moist. No oropharyngeal exudate.  No lip swelling or tongue swelling  Eyes: Pupils are equal, round, and reactive to light. Conjunctivae and EOM are normal.  Neck: Normal range of motion. Neck supple.  Cardiovascular: Normal rate, regular rhythm, normal heart sounds and intact distal pulses.  No murmur heard. Pulmonary/Chest: Effort normal and breath sounds normal. No stridor. No respiratory distress. She has no wheezes. She has no rales.  Abdominal: Soft. There is no tenderness.  Musculoskeletal: She exhibits no edema.  Neurological: She is alert and oriented to person, place, and time. No cranial nerve deficit or sensory deficit. She exhibits normal muscle tone. Coordination normal.  5+/5 strength, normal sensation, no drift, normal gait, normal finger to nose finger  Skin: Skin is warm and dry. No rash noted.  Psychiatric: She has a normal mood and affect.  Nursing note and vitals reviewed.    ED Treatments / Results  Labs (all labs ordered are listed, but only abnormal results are displayed) Labs Reviewed  POC URINE PREG, ED    EKG None  Radiology No results found.  Procedures Procedures (including critical care time)  Medications Ordered in ED Medications  dexamethasone (DECADRON) tablet 10 mg (has no administration in time range)  diphenhydrAMINE (BENADRYL) capsule 25 mg (25 mg Oral Given 04/18/18 1745)  prochlorperazine (COMPAZINE) tablet 10 mg (10 mg Oral Given 04/18/18 1746)     Initial Impression / Assessment and Plan / ED Course  I have reviewed the triage vital signs and the nursing notes.  Pertinent labs & imaging results that were available during my care of the patient were reviewed by me and considered in my medical decision making (see chart for details).     Vickie [name] is a 27 year old female with history of migraines who presents to the ED with headache.  Patient with normal vitals upon arrival.   No fever.  Patient took prescription NSAID today for migraine to the first time and had some tingling feeling in her lips and was concerned for allergic reaction.  However, patient with no wheezing, no shortness of breath, no GI symptoms, no rash.  Airway is clear.  Likely paresthesia from migraine aura.  No concern for allergic reaction at this time.  She states her headache is slightly worse than normal.  She takes Topamax daily for headaches and did not get any relief with medication she took today.  Patient with normal neurological exam.  No fever.  No concern for intracranial bleed, infectious process.  Patient appears to have worsening migraine headache at this time. Patient given p.o. Benadryl and Compazine with improvement of symptoms.  Pregnancy test was negative and patient given dose of Decadron  prior to discharge.  Patient to follow-up with neurology as scheduled and told to return to the ED if symptoms worsen.  Patient hemodynamically stable throughout my care.  Final Clinical Impressions(s) / ED Diagnoses   Final diagnoses:  Other migraine without status migrainosus, not intractable    ED Discharge Orders    None       Virgina Norfolk, DO 04/18/18 1835

## 2018-04-18 NOTE — ED Triage Notes (Addendum)
Pt c/o migraine (light/sound senistivity, nausea) since last night. Pt reports she took a medication on a trial basis starting today called cambia for migraines. Pt reports she felt like her lips and face were swelling up at that time around 1 hour ago. Pt reports her lips are not swelling like they were before, but that she believes her "face is a little more swollen that normal." MD informed pt to be seen in ED for evaluation. Denies shortness of breath.

## 2019-11-12 ENCOUNTER — Ambulatory Visit: Payer: BC Managed Care – PPO | Attending: Internal Medicine

## 2019-11-12 DIAGNOSIS — Z23 Encounter for immunization: Secondary | ICD-10-CM | POA: Insufficient documentation

## 2019-11-12 NOTE — Progress Notes (Signed)
   Covid-19 Vaccination Clinic  Name:  Vickie [name]    MRN: [id number] DOB: 03/19/1991  11/12/2019  [name] was observed post Covid-19 immunization for 15 minutes without incident. She was provided with Vaccine Information Sheet and instruction to access the V-Safe system.   [name] was instructed to call 911 with any severe reactions post vaccine: Marland Kitchen Difficulty breathing  . Swelling of face and throat  . A fast heartbeat  . A bad rash all over body  . Dizziness and weakness   Immunizations Administered    Name Date Dose VIS Date Route   Pfizer COVID-19 Vaccine 11/12/2019  3:08 PM 0.3 mL 08/17/2019 Intramuscular   Manufacturer: [employer], [employer]   Lot: [id number]   NDC: [address]-8

## 2019-12-12 ENCOUNTER — Ambulatory Visit: Payer: BC Managed Care – PPO | Attending: Internal Medicine

## 2019-12-12 DIAGNOSIS — Z23 Encounter for immunization: Secondary | ICD-10-CM

## 2019-12-12 NOTE — Progress Notes (Signed)
   Covid-19 Vaccination Clinic  Name:  Vickie [name]    MRN: [id number] DOB: 12-Aug-1991  12/12/2019  [name] was observed post Covid-19 immunization for 15 minutes without incident. She was provided with Vaccine Information Sheet and instruction to access the V-Safe system.   [name] was instructed to call 911 with any severe reactions post vaccine: Marland Kitchen Difficulty breathing  . Swelling of face and throat  . A fast heartbeat  . A bad rash all over body  . Dizziness and weakness   Immunizations Administered    Name Date Dose VIS Date Route   Pfizer COVID-19 Vaccine 12/12/2019 12:00 PM 0.3 mL 08/17/2019 Intramuscular   Manufacturer: [employer], [employer]   Lot: [id number]   NDC: [address]-2

## 2020-01-09 ENCOUNTER — Other Ambulatory Visit: Payer: Self-pay

## 2020-01-09 ENCOUNTER — Encounter (HOSPITAL_COMMUNITY): Payer: Self-pay

## 2020-01-09 ENCOUNTER — Emergency Department (HOSPITAL_COMMUNITY)
Admission: EM | Admit: 2020-01-09 | Discharge: 2020-01-09 | Disposition: A | Discharge status: Home / Self Care | Attending: Emergency Medicine | Admitting: Emergency Medicine

## 2020-01-09 DIAGNOSIS — Z203 Contact with and (suspected) exposure to rabies: Secondary | ICD-10-CM | POA: Insufficient documentation

## 2020-01-09 DIAGNOSIS — Z79899 Other long term (current) drug therapy: Secondary | ICD-10-CM | POA: Diagnosis not present

## 2020-01-09 DIAGNOSIS — R6889 Other general symptoms and signs: Secondary | ICD-10-CM

## 2020-01-09 NOTE — ED Provider Notes (Signed)
[facility] Provider Note   CSN: [id number] Arrival date & time: 01/09/20  1443     History Chief Complaint  Patient presents with  . Rabies exposure    Vickie [name] is a 29 y.o. female with PMHx anemia and anxiety who presents to the ED today with rabies exposure. 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 Pt reports she picked up the dog and handed it to it's own on Saturday 05/01 and was told by her employer that she needs to come get vaccinated for rabies. Pt denies being bitten or scratched by the dog itself. She does not think she came into contact with it's saliva but she is not entirely sure if it could have gotten on her arms when she held it. Pt has no complaints currently.   The history is provided by the patient and medical records.       Past Medical History:  Diagnosis Date  . Anemia   . Anxiety   . Tachycardia     Patient Active Problem List   Diagnosis Date Noted  . Emesis 04/12/2016    Past Surgical History:  Procedure Laterality Date  . PILONIDAL CYST EXCISION       OB History   No obstetric history on file.     Family History  Problem Relation Age of Onset  . Mental illness Mother   . Diabetes Maternal Grandmother   . Hyperlipidemia Maternal Grandmother   . Hypertension Maternal Grandmother   . Hyperlipidemia Maternal Grandfather   . Hypertension Maternal Grandfather   . Diabetes Maternal Grandfather   . Diabetes Paternal Grandmother     Social History   Tobacco Use  . Smoking status: Never Smoker  . Smokeless tobacco: Never Used  Substance Use Topics  . Alcohol use: Yes    Comment: occasional  . Drug use: No    Home Medications Prior to Admission medications   Medication Sig Start Date End Date Taking? Authorizing Provider  Amphetamine Sulfate (EVEKEO) 10 MG TABS Take 5-10 mg by mouth See admin  instructions. Take 10 mg at 7 AM and 5 mg at 12 PM.    [provider]  clonazePAM (KLONOPIN) 1 MG tablet Take 0.5-1 mg by mouth at bedtime as needed for sleep or anxiety. At 9 PM. 03/13/18   [provider]  magnesium oxide (MAG-OX) 400 MG tablet Take 1 tablet by mouth 2 (two) times daily. 04/06/18   [provider]  TROKENDI XR 50 MG CP24 Take 50 mg by mouth at bedtime.  04/07/18   [provider]    Allergies    Patient has no known allergies.  Review of Systems   Review of Systems  Constitutional: Negative for chills and fever.  Skin: Negative for wound.    Physical Exam Updated Vital Signs BP (!) 147/97 (BP Location: Right Arm)   Pulse 94   Temp 98.2 F (36.8 C) (Oral)   Resp 16   LMP 12/23/2019   SpO2 99%   Physical Exam Vitals and nursing note reviewed.  Constitutional:      Appearance: She is not ill-appearing.  HENT:     Head: Normocephalic and atraumatic.  Eyes:     Conjunctiva/sclera: Conjunctivae normal.  Cardiovascular:     Rate and Rhythm: Normal rate and regular rhythm.     Pulses: Normal pulses.  Pulmonary:  Effort: Pulmonary effort is normal.     Breath sounds: Normal breath sounds. No wheezing, rhonchi or rales.  Skin:    General: Skin is warm and dry.     Coloration: Skin is not jaundiced.  Neurological:     Mental Status: She is alert.     ED Results / Procedures / Treatments   Labs (all labs ordered are listed, but only abnormal results are displayed) Labs Reviewed - No data to display  EKG None  Radiology No results found.  Procedures Procedures (including critical care time)  Medications Ordered in ED Medications - No data to display  ED Course  I have reviewed the triage vital signs and the nursing notes.  Pertinent labs & imaging results that were available during my care of the patient were reviewed by me and considered in my medical decision making (see chart for details).    MDM  Rules/Calculators/A&P                      29 year old female with concern for possible rabies exposure at work. 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 No bite, scratch, or contact with saliva. 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 I discussed case with Rabies person on call Danae Chen at the [facility] [phone]) who agrees that patient does not require post exposure prophylaxis given no obvious exposure. Pt discharged home at this time. Advised to follow up with her PCP regarding ED visit today.   This note was prepared using Dragon voice recognition software and may include unintentional dictation errors due to the inherent limitations of voice recognition software.  Final Clinical Impression(s) / ED Diagnoses Final diagnoses:  Suspected rabies    Rx / DC Orders ED Discharge Orders    None       Discharge Instructions     It has been determined that you did not have a proper exposure to the rabid animal including being bitten/scratched or had exposure via mucous membranes therefore did not require the rabies post exposure prophylaxis today        Eustaquio Maize, PA-C 01/09/20 [place], [place], DO 01/09/20 1742

## 2020-01-09 NOTE — Discharge Instructions (Signed)
It has been determined that you did not have a proper exposure to the rabid animal including being bitten/scratched or had exposure via mucous membranes therefore did not require the rabies post exposure prophylaxis today

## 2020-01-09 NOTE — ED Triage Notes (Addendum)
Patient reports working at [employer] and was exposed to a dog who was positive for rabies over the weekend.   Patient reports she just picked up the dog.  Denies dog bite.  Pt denies any symptoms.    A/Ox4 Ambulatory in triage

## 2020-11-29 ENCOUNTER — Emergency Department (HOSPITAL_COMMUNITY)
Admission: EM | Admit: 2020-11-29 | Discharge: 2020-11-30 | Disposition: A | Discharge status: Home / Self Care | Payer: BC Managed Care – PPO | Attending: Emergency Medicine | Admitting: Emergency Medicine

## 2020-11-29 ENCOUNTER — Other Ambulatory Visit: Payer: Self-pay

## 2020-11-29 ENCOUNTER — Encounter (HOSPITAL_COMMUNITY): Payer: Self-pay | Admitting: Emergency Medicine

## 2020-11-29 ENCOUNTER — Emergency Department (HOSPITAL_COMMUNITY): Payer: BC Managed Care – PPO

## 2020-11-29 DIAGNOSIS — M25512 Pain in left shoulder: Secondary | ICD-10-CM | POA: Diagnosis present

## 2020-11-29 DIAGNOSIS — R0789 Other chest pain: Secondary | ICD-10-CM | POA: Diagnosis not present

## 2020-11-29 DIAGNOSIS — Z79899 Other long term (current) drug therapy: Secondary | ICD-10-CM | POA: Insufficient documentation

## 2020-11-29 MED ORDER — ACETAMINOPHEN 325 MG PO TABS
650.0000 mg | ORAL_TABLET | Freq: Once | ORAL | Status: AC
  Administered 2020-11-29: 650 mg via ORAL
  Filled 2020-11-29: qty 2
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
## 2020-11-30 NOTE — Discharge Instructions (Addendum)
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
## 2022-08-08 ENCOUNTER — Ambulatory Visit
Admission: EM | Admit: 2022-08-08 | Discharge: 2022-08-08 | Disposition: A | Discharge status: Home / Self Care | Payer: BC Managed Care – PPO | Attending: Emergency Medicine | Admitting: Emergency Medicine

## 2022-08-08 DIAGNOSIS — J4521 Mild intermittent asthma with (acute) exacerbation: Secondary | ICD-10-CM | POA: Diagnosis present

## 2022-08-08 DIAGNOSIS — U071 COVID-19: Secondary | ICD-10-CM | POA: Insufficient documentation

## 2022-08-08 DIAGNOSIS — J309 Allergic rhinitis, unspecified: Secondary | ICD-10-CM | POA: Diagnosis present

## 2022-08-08 DIAGNOSIS — J22 Unspecified acute lower respiratory infection: Secondary | ICD-10-CM

## 2022-08-08 LAB — RESP PANEL BY RT-PCR (FLU A&B, COVID) ARPGX2
Influenza A by PCR: NEGATIVE
Influenza B by PCR: NEGATIVE
SARS Coronavirus 2 by RT PCR: POSITIVE — AB

## 2022-08-08 MED ORDER — FLUTICASONE-SALMETEROL 100-50 MCG/ACT IN AEPB: 1.0000 | INHALATION_SPRAY | Freq: Two times a day (BID) | RESPIRATORY_TRACT | 2 refills | Status: AC

## 2022-08-08 MED ORDER — PROMETHAZINE-DM 6.25-15 MG/5ML PO SYRP: 5.0000 mL | ORAL_SOLUTION | Freq: Every evening | ORAL | 0 refills | Status: AC | PRN

## 2022-08-08 MED ORDER — FLUTICASONE PROPIONATE 50 MCG/ACT NA SUSP: 1.0000 | Freq: Every day | NASAL | 1 refills | Status: AC

## 2022-08-08 MED ORDER — MONTELUKAST SODIUM 10 MG PO TABS: 10.0000 mg | ORAL_TABLET | Freq: Every day | ORAL | 2 refills | Status: AC

## 2022-08-08 MED ORDER — IPRATROPIUM BROMIDE 0.06 % NA SOLN: 2.0000 | Freq: Three times a day (TID) | NASAL | 1 refills | Status: AC

## 2022-08-08 MED ORDER — GUAIFENESIN 400 MG PO TABS: ORAL_TABLET | ORAL | 0 refills | Status: AC

## 2022-08-08 MED ORDER — CETIRIZINE HCL 10 MG PO TABS: 10.0000 mg | ORAL_TABLET | Freq: Every day | ORAL | 1 refills | Status: AC

## 2022-08-08 MED ORDER — METHYLPREDNISOLONE SODIUM SUCC 125 MG IJ SOLR
80.0000 mg | Freq: Once | INTRAMUSCULAR | Status: AC
  Administered 2022-08-08: 80 mg via INTRAMUSCULAR

## 2022-08-08 MED ORDER — ALBUTEROL SULFATE HFA 108 (90 BASE) MCG/ACT IN AERS: 2.0000 | INHALATION_SPRAY | Freq: Four times a day (QID) | RESPIRATORY_TRACT | 2 refills | Status: AC | PRN

## 2022-08-08 NOTE — ED Triage Notes (Signed)
Pt reports cough, nasal congestion and fever x 5 days. Pt said she was treated it for sinus infection an bronchitis 4 weeks ago. Pt taking OTC cod meds.

## 2022-08-08 NOTE — Discharge Instructions (Addendum)
At this time, I believe that you are suffering from a viral infection that has caused inflammation similar to the inflammation allergy sufferers experience when their allergies "flareup".  I believe that you are flared up in both your upper and lower respiratory tracts at this time.    You received a COVID-19 and influenza PCR test today.  The results of your PCR testing will be posted to your MyChart once it is complete.  This typically takes 6 to 12 hours.  If there is a positive result, you will be contacted by phone and provided with further information.    Please read below to learn more about the medications, dosages and frequencies that I recommend to help alleviate your symptoms and to help you feel better soon:   Solu-Medrol IM (methylprednisolone):  To quickly address your significant respiratory inflammation, you were provided with an injection of Solu-Medrol in the office today.  You should continue to feel the full benefit of the steroid for the next 4 to 6 hours.    Zyrtec (cetirizine): This is an excellent second-generation antihistamine that helps to reduce respiratory inflammatory response to environmental allergens.  In some patients, this medication can cause daytime sleepiness so I recommend that you take 1 tablet daily at bedtime.  I renewed your prescription for you.   Singulair (montelukast): This is a mast cell stabilizer that works well with antihistamines.  Mast cells are responsible for stimulating histamine production so you can imagine that if we can reduce the activity of your mast cells, then fewer histamines will be produced and inflammation caused by allergy exposure will be significantly reduced.  I recommend that you take this medication at the same time you take your antihistamine.  Please stay on this medication through the winter, it will help to keep your lungs calm and provided an extra layer of protection to help you avoid catching more respiratory  infections.  Flonase (fluticasone): This is a steroid nasal spray that you use once daily, 1 spray in each nare.  This medication does not work well if you decide to use it only used as you feel you need to, it works best used on a daily basis.  After 3 to 5 days of use, you will notice significant reduction of the inflammation and mucus production that is currently being caused by exposure to allergens, whether seasonal or environmental.  The most common side effect of this medication is nosebleeds.  If you experience a nosebleed, please discontinue use for 1 week, then feel free to resume.  Please stay on this medication through the winter.  It will also provide you with an extra protective layer to help you avoid catching or respiratory infections.    Atrovent (ipratropium): This is an excellent nasal decongestant spray I have added to your recommended nasal steroid that will not cause rebound congestion, please instill 2 sprays into each nare with each use.  Because nasal steroids can take several days before they begin to provide full benefit, I recommend that you use this spray in addition to the nasal steroid prescribed for you.  Please use it after you have used your nasal steroid and repeat up to 4 times daily as needed.        ProAir, Ventolin, Proventil (albuterol): This inhaled medication contains a short acting beta agonist bronchodilator.  This medication works on the smooth muscle that opens and constricts of your airways by relaxing the muscle.  The result of relaxation of the smooth  muscle is increased air movement and improved work of breathing.  This is a short acting medication that can be used every 4-6 hours as needed for increased work of breathing, shortness of breath, wheezing and excessive coughing.  I have provided you with a renewed prescription and further refills.    Advair (fluticasone and salmeterol): Please inhale 1 puff twice daily.  Please use this after you have inhaled 2  puffs of albuterol for best results.  This inhaled medication contains a corticosteroid and long-acting form of albuterol.  The inhaled steroid and this medication  is not absorbed into the body and will not cause side effects such as increased blood sugar levels, irritability, sleeplessness or weight gain.  Inhaled corticosteroid are sort of like topical steroid creams but, as you can imagine, it is not practical to attempt to rub a steroid cream inside of your lungs.  The long-acting albuterol works similarly to the short acting albuterol found in your rescue inhaler but provides 24-hour relaxation of the smooth muscles that open and constrict your airways; your short acting rescue inhaler can only provide for a few hours this benefit for a few hours.  Please feel free to continue using your short acting rescue inhaler as often as needed throughout the day for shortness of breath, wheezing, and cough.  You may find that you no longer need to use this medication after 7 to 10 days which is perfectly fine.  Please pull it out and use it anytime you start to feel that little tickle in your throat or are concerned that you are coming down with another respiratory infection.    Robitussin, Mucinex (guaifenesin): This is an expectorant.  This helps break up chest congestion and loosen up thick nasal drainage making phlegm and drainage more liquid and therefore easier to remove.  I recommend being 400 mg three times daily as needed.      Promethazine DM: Promethazine is both a nasal decongestant and an antinausea medication that makes most patients feel fairly sleepy.  The DM is dextromethorphan, a cough suppressant found in many over-the-counter cough medications.  Please take 5 mL before bedtime to minimize your cough which will help you sleep better.  I have sent a prescription for this medication to your pharmacy.   If you find that you have not had improvement of your symptoms in the next 5 to 7 days, please  follow-up with your primary care provider or return here to urgent care for repeat evaluation and further recommendations.   Thank you for visiting urgent care today.  We appreciate the opportunity to participate in your care.   If you find that your health insurance will not pay for allergy medications, please consider downloading and using the GoodRx app to get a better price than the "off the shelf" price.

## 2022-08-08 NOTE — ED Provider Notes (Signed)
[facility]    CSN: [id number] Arrival date & time: 08/08/22  [id number]    HISTORY   Chief Complaint  Patient presents with  . Cough  . Fever   HPI Vickie [name] is a pleasant, 31 y.o. female who presents to urgent care today. Patient complains of sore throat, tactile fever, non-productive cough, clear rhinorrhea, nasal congestion for 5 days.  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  Patient states she was treated at a different urgent care for a bacterial sinus infection and bacterial bronchitis on July 22, 2022 with a 7-day course of Augmentin, urgent care note reviewed by me, patient had been experiencing symptoms for 3 days prior to diagnosis and treatment.  Patient was tested for flu, COVID-19 and strep, all results were negative.  Patient states that after taking the Augmentin for 7 days, she slowly began to feel better and felt mostly well until 5 days ago.  Patient states she has been taking over over-the-counter cough and cold preparations with little relief.  Patient has a history of asthma and allergies, states taking cetirizine daily, has been using the albuterol inhaler that was provided by urgent care provider on November 16 when she is experiencing "cluster coughs", states this helps some but not completely.  Patient has an elevated temperature on arrival with an elevated heart rate and high blood pressure.    The history is provided by the patient.   Past Medical History:  Diagnosis Date  . Anemia   . Anxiety   . Tachycardia    Patient Active Problem List   Diagnosis Date Noted  . Emesis 04/12/2016   Past Surgical History:  Procedure Laterality Date  . PILONIDAL CYST EXCISION     OB History   No obstetric history on file.    Home Medications    Prior to Admission medications   Medication Sig Start Date End Date Taking? Authorizing Provider  albuterol (VENTOLIN HFA) 108 (90 Base) MCG/ACT inhaler Inhale into the lungs. 07/22/22  08/21/22 Yes [provider]  buPROPion (WELLBUTRIN XL) 300 MG 24 hr tablet Take 300 mg by mouth every morning. 08/11/20   [provider]  cetirizine (ZYRTEC) 10 MG tablet Take 10 mg by mouth daily.    [provider]  clonazePAM (KLONOPIN) 1 MG tablet Take 1 mg by mouth at bedtime as needed for sleep or anxiety. At 9 PM. 03/13/18   [provider]  fluticasone (FLONASE) 50 MCG/ACT nasal spray Place 1 spray into both nostrils daily. 10/02/20 10/02/21  [provider]  magnesium oxide (MAG-OX) 400 MG tablet Take 1 tablet by mouth 2 (two) times daily. Patient not taking: Reported on 11/29/2020 04/06/18   [provider]  naratriptan (AMERGE) 2.5 MG tablet Take 2.5 mg by mouth daily as needed for migraine. 10/02/20   [provider]  promethazine (PHENERGAN) 25 MG tablet Take 25 mg by mouth every 8 (eight) hours as needed for nausea or vomiting. 01/21/20   [provider]  Topiramate ER (TROKENDI XR) 100 MG CP24 Take 100 mg by mouth daily.    [provider]  WEGOVY 2.4 MG/0.75ML SOAJ Inject into the skin.    [provider]    Family History Family History  Problem Relation Age of Onset  . Mental illness Mother   . Diabetes Maternal Grandmother   . Hyperlipidemia Maternal Grandmother   . Hypertension Maternal Grandmother   . Hyperlipidemia Maternal Grandfather   .  Hypertension Maternal Grandfather   . Diabetes Maternal Grandfather   . Diabetes Paternal Grandmother    Social History Social History   Tobacco Use  . Smoking status: Never  . Smokeless tobacco: Never  Substance Use Topics  . Alcohol use: Yes    Comment: occasional  . Drug use: No   Allergies   Diclofenac potassium, Diclofenac, Other, Rizatriptan, and Rizatriptan benzoate  Review of Systems Review of Systems Pertinent findings revealed after performing a 14 point review of systems has been noted in the history of present  illness.  Physical Exam Triage Vital Signs ED Triage Vitals  Enc Vitals Group     BP 07/03/21 0827 (!) 147/82     Pulse Rate 07/03/21 0827 72     Resp 07/03/21 0827 18     Temp 07/03/21 0827 98.3 F (36.8 C)     Temp Source 07/03/21 0827 Oral     SpO2 07/03/21 0827 98 %     Weight --      Height --      Head Circumference --      Peak Flow --      Pain Score 07/03/21 0826 5     Pain Loc --      Pain Edu? --      Excl. in GC? --   No data found.  Updated Vital Signs BP (!) 147/92 (BP Location: Right Wrist)   Pulse (!) 113   Temp 100.1 F (37.8 C) (Oral)   Resp 18   LMP 07/11/2022 (Exact Date)   SpO2 96%   Physical Exam Vitals and nursing note reviewed.  Constitutional:      General: She is not in acute distress.    Appearance: Normal appearance. She is not ill-appearing.  HENT:     Head: Normocephalic and atraumatic.     Salivary Glands: Right salivary gland is not diffusely enlarged or tender. Left salivary gland is not diffusely enlarged or tender.     Right Ear: Ear canal and external ear normal. No drainage. A middle ear effusion is present. There is no impacted cerumen. Tympanic membrane is bulging. Tympanic membrane is not injected or erythematous.     Left Ear: Ear canal and external ear normal. No drainage. A middle ear effusion is present. There is no impacted cerumen. Tympanic membrane is bulging. Tympanic membrane is not injected or erythematous.     Ears:     Comments: Bilateral EACs normal, both TMs bulging with clear fluid    Nose: Rhinorrhea present. No nasal deformity, septal deviation, signs of injury, nasal tenderness, mucosal edema or congestion. Rhinorrhea is clear.     Right Nostril: Occlusion present. No foreign body, epistaxis or septal hematoma.     Left Nostril: Occlusion present. No foreign body, epistaxis or septal hematoma.     Right Turbinates: Enlarged, swollen and pale.     Left Turbinates: Enlarged, swollen and pale.     Right Sinus: No  maxillary sinus tenderness or frontal sinus tenderness.     Left Sinus: No maxillary sinus tenderness or frontal sinus tenderness.     Mouth/Throat:     Lips: Pink. No lesions.     Mouth: Mucous membranes are moist. No oral lesions.     Pharynx: Oropharynx is clear. Uvula midline. No posterior oropharyngeal erythema or uvula swelling.     Tonsils: No tonsillar exudate. 0 on the right. 0 on the left.     Comments: Postnasal drip Eyes:  General: Lids are normal.        Right eye: No discharge.        Left eye: No discharge.     Extraocular Movements: Extraocular movements intact.     Conjunctiva/sclera: Conjunctivae normal.     Right eye: Right conjunctiva is not injected.     Left eye: Left conjunctiva is not injected.  Neck:     Trachea: Trachea and phonation normal.  Cardiovascular:     Rate and Rhythm: Normal rate and regular rhythm.     Pulses: Normal pulses.     Heart sounds: Normal heart sounds. No murmur heard.    No friction rub. No gallop.  Pulmonary:     Effort: Pulmonary effort is normal. No tachypnea, bradypnea, accessory muscle usage, prolonged expiration, respiratory distress or retractions.     Breath sounds: Normal breath sounds. No stridor, decreased air movement or transmitted upper airway sounds. No decreased breath sounds, wheezing, rhonchi or rales.     Comments: High-pitched, turbulent breath sounds throughout all lung fields Chest:     Chest wall: No tenderness.  Musculoskeletal:        General: Normal range of motion.     Cervical back: Normal range of motion and neck supple. Normal range of motion.  Lymphadenopathy:     Cervical: No cervical adenopathy.  Skin:    General: Skin is warm and dry.     Findings: No erythema or rash.  Neurological:     General: No focal deficit present.     Mental Status: She is alert and oriented to person, place, and time.  Psychiatric:        Mood and Affect: Mood normal.        Behavior: Behavior normal.    Visual  Acuity Right Eye Distance:   Left Eye Distance:   Bilateral Distance:    Right Eye Near:   Left Eye Near:    Bilateral Near:     [facility] Couse / Diagnostics / Procedures:     Radiology No results found.  Procedures Procedures (including critical care time) EKG  Pending results:  Labs Reviewed  RESP PANEL BY RT-PCR (FLU A&B, COVID) [id number]    Medications Ordered in UC: Medications  methylPREDNISolone sodium succinate (SOLU-MEDROL) 125 mg/2 mL injection 80 mg (80 mg Intramuscular Given 08/08/22 1004)    UC Diagnoses / Final Clinical Impressions(s)   I have reviewed the triage vital signs and the nursing notes.  Pertinent labs & imaging results that were available during my care of the patient were reviewed by me and considered in my medical decision making (see chart for details).    Final diagnoses:  Acute respiratory infection  Allergic rhinitis, unspecified seasonality, unspecified trigger  Mild intermittent extrinsic asthma with acute exacerbation   Physical exam findings are concerning for uncontrolled respiratory allergies and asthma.  Patient provided with an injection of Solu-Medrol during her visit today.  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  Patient also provided with guaifenesin to be used as an expectorant should she develop a productive cough and Promethazine DM to help soothe nighttime cough so she can get some sleep.  COVID-19 and influenza testing performed to be complete.  No indication for antibiotics at this time.  Return precautions advised.  ED Prescriptions     Medication Sig Dispense Auth. Provider   cetirizine (ZYRTEC ALLERGY) 10 MG  tablet Take 1 tablet (10 mg total) by mouth at bedtime. 90 tablet Theadora Rama Scales, PA-C   fluticasone (FLONASE) 50 MCG/ACT nasal spray Place 1 spray into both nostrils daily. 47.4  mL Theadora Rama Scales, PA-C   ipratropium (ATROVENT) 0.06 % nasal spray Place 2 sprays into both nostrils 3 (three) times daily. As needed for nasal congestion, runny nose 15 mL Theadora Rama Scales, PA-C   montelukast (SINGULAIR) 10 MG tablet Take 1 tablet (10 mg total) by mouth at bedtime. 30 tablet Theadora Rama Scales, PA-C   fluticasone-salmeterol (ADVAIR DISKUS) 100-50 MCG/ACT AEPB Inhale 1 puff into the lungs 2 (two) times daily. 60 each Theadora Rama Scales, PA-C   albuterol (VENTOLIN HFA) 108 (90 Base) MCG/ACT inhaler Inhale 2 puffs into the lungs every 6 (six) hours as needed for wheezing or shortness of breath (Cough). 36 g Theadora Rama Scales, PA-C   guaifenesin (HUMIBID E) 400 MG TABS tablet Take 1 tablet 3 times daily as needed for chest congestion and cough 21 tablet Theadora Rama Scales, PA-C   promethazine-dextromethorphan (PROMETHAZINE-DM) 6.25-15 MG/5ML syrup Take 5 mLs by mouth at bedtime as needed for cough. 60 mL Theadora Rama Scales, PA-C      PDMP not reviewed this encounter.  Disposition Upon Discharge:  Condition: stable for discharge home Home: take medications as prescribed; routine discharge instructions as discussed; follow up as advised.  Patient presented with an acute illness with associated systemic symptoms and significant discomfort requiring urgent management. In my opinion, this is a condition that a prudent lay person (someone who possesses an average knowledge of health and medicine) may potentially expect to result in complications if not addressed urgently such as respiratory distress, impairment of bodily function or dysfunction of bodily organs.   Routine symptom specific, illness specific and/or disease specific instructions were discussed with the patient and/or caregiver at length.   As such, the patient has been evaluated and assessed, work-up was performed and treatment was provided in alignment with urgent care protocols and  evidence based medicine.  Patient/parent/caregiver has been advised that the patient may require follow up for further testing and treatment if the symptoms continue in spite of treatment, as clinically indicated and appropriate.  If the patient was tested for COVID-19, Influenza and/or RSV, then the patient/parent/guardian was advised to isolate at home pending the results of his/her diagnostic coronavirus test and potentially longer if they're positive. I have also advised pt that if his/her COVID-19 test returns positive, it's recommended to self-isolate for at least 10 days after symptoms first appeared AND until fever-free for 24 hours without fever reducer AND other symptoms have improved or resolved. Discussed self-isolation recommendations as well as instructions for household member/close contacts as per the [place] [employer], and also gave patient the COVID packet with this information.  Patient/parent/caregiver has been advised to return to the [facility] or PCP in 3-5 days if no better; to PCP or the Emergency Department if new signs and symptoms develop, or if the current signs or symptoms continue to change or worsen for further workup, evaluation and treatment as clinically indicated and appropriate  The patient will follow up with their current PCP if and as advised. If the patient does not currently have a PCP we will assist them in obtaining one.   The patient may need specialty follow up if the symptoms continue, in spite of conservative treatment and management, for further workup, evaluation, consultation and treatment as clinically indicated and appropriate.  Patient/parent/caregiver  verbalized understanding and agreement of plan as discussed.  All questions were addressed during visit.  Please see discharge instructions below for further details of plan.  Discharge Instructions:   Discharge Instructions      At this time, I believe that you are suffering from a viral infection that  has caused inflammation similar to the inflammation allergy sufferers experience when their allergies "flareup".  I believe that you are flared up in both your upper and lower respiratory tracts at this time.    You received a COVID-19 and influenza PCR test today.  The results of your PCR testing will be posted to your MyChart once it is complete.  This typically takes 6 to 12 hours.  If there is a positive result, you will be contacted by phone and provided with further information.    Please read below to learn more about the medications, dosages and frequencies that I recommend to help alleviate your symptoms and to help you feel better soon:   Solu-Medrol IM (methylprednisolone):  To quickly address your significant respiratory inflammation, you were provided with an injection of Solu-Medrol in the office today.  You should continue to feel the full benefit of the steroid for the next 4 to 6 hours.    Zyrtec (cetirizine): This is an excellent second-generation antihistamine that helps to reduce respiratory inflammatory response to environmental allergens.  In some patients, this medication can cause daytime sleepiness so I recommend that you take 1 tablet daily at bedtime.  I renewed your prescription for you.   Singulair (montelukast): This is a mast cell stabilizer that works well with antihistamines.  Mast cells are responsible for stimulating histamine production so you can imagine that if we can reduce the activity of your mast cells, then fewer histamines will be produced and inflammation caused by allergy exposure will be significantly reduced.  I recommend that you take this medication at the same time you take your antihistamine.  Please stay on this medication through the winter, it will help to keep your lungs calm and provided an extra layer of protection to help you avoid catching more respiratory infections.  Flonase (fluticasone): This is a steroid nasal spray that you use once daily,  1 spray in each nare.  This medication does not work well if you decide to use it only used as you feel you need to, it works best used on a daily basis.  After 3 to 5 days of use, you will notice significant reduction of the inflammation and mucus production that is currently being caused by exposure to allergens, whether seasonal or environmental.  The most common side effect of this medication is nosebleeds.  If you experience a nosebleed, please discontinue use for 1 week, then feel free to resume.  Please stay on this medication through the winter.  It will also provide you with an extra protective layer to help you avoid catching or respiratory infections.    Atrovent (ipratropium): This is an excellent nasal decongestant spray I have added to your recommended nasal steroid that will not cause rebound congestion, please instill 2 sprays into each nare with each use.  Because nasal steroids can take several days before they begin to provide full benefit, I recommend that you use this spray in addition to the nasal steroid prescribed for you.  Please use it after you have used your nasal steroid and repeat up to 4 times daily as needed.        ProAir, Ventolin,  Proventil (albuterol): This inhaled medication contains a short acting beta agonist bronchodilator.  This medication works on the smooth muscle that opens and constricts of your airways by relaxing the muscle.  The result of relaxation of the smooth muscle is increased air movement and improved work of breathing.  This is a short acting medication that can be used every 4-6 hours as needed for increased work of breathing, shortness of breath, wheezing and excessive coughing.  I have provided you with a renewed prescription and further refills.    Advair (fluticasone and salmeterol): Please inhale 1 puff twice daily.  Please use this after you have inhaled 2 puffs of albuterol for best results.  This inhaled medication contains a corticosteroid and  long-acting form of albuterol.  The inhaled steroid and this medication  is not absorbed into the body and will not cause side effects such as increased blood sugar levels, irritability, sleeplessness or weight gain.  Inhaled corticosteroid are sort of like topical steroid creams but, as you can imagine, it is not practical to attempt to rub a steroid cream inside of your lungs.  The long-acting albuterol works similarly to the short acting albuterol found in your rescue inhaler but provides 24-hour relaxation of the smooth muscles that open and constrict your airways; your short acting rescue inhaler can only provide for a few hours this benefit for a few hours.  Please feel free to continue using your short acting rescue inhaler as often as needed throughout the day for shortness of breath, wheezing, and cough.  You may find that you no longer need to use this medication after 7 to 10 days which is perfectly fine.  Please pull it out and use it anytime you start to feel that little tickle in your throat or are concerned that you are coming down with another respiratory infection.    Robitussin, Mucinex (guaifenesin): This is an expectorant.  This helps break up chest congestion and loosen up thick nasal drainage making phlegm and drainage more liquid and therefore easier to remove.  I recommend being 400 mg three times daily as needed.      Promethazine DM: Promethazine is both a nasal decongestant and an antinausea medication that makes most patients feel fairly sleepy.  The DM is dextromethorphan, a cough suppressant found in many over-the-counter cough medications.  Please take 5 mL before bedtime to minimize your cough which will help you sleep better.  I have sent a prescription for this medication to your pharmacy.   If you find that you have not had improvement of your symptoms in the next 5 to 7 days, please follow-up with your primary care provider or return here to urgent care for repeat evaluation  and further recommendations.   Thank you for visiting urgent care today.  We appreciate the opportunity to participate in your care.   If you find that your health insurance will not pay for allergy medications, please consider downloading and using the GoodRx app to get a better price than the "off the shelf" price.           This office note has been dictated using Teaching laboratory technician.  Unfortunately, this method of dictation can sometimes lead to typographical or grammatical errors.  I apologize for your inconvenience in advance if this occurs.  Please do not hesitate to reach out to me if clarification is needed.      Theadora Rama Scales, PA-C 08/08/22 1924
